# Patient Record
Sex: Female | Born: 1955 | Race: White | Hispanic: No | Smoking: Never smoker
Health system: Southern US, Community
[De-identification: ages and names within clinical notes are randomized; demographics above are authoritative.]

## PROBLEM LIST (undated history)

## (undated) DIAGNOSIS — R519 Headache, unspecified: Secondary | ICD-10-CM

## (undated) DIAGNOSIS — M436 Torticollis: Secondary | ICD-10-CM

## (undated) DIAGNOSIS — Z5189 Encounter for other specified aftercare: Secondary | ICD-10-CM

## (undated) DIAGNOSIS — G243 Spasmodic torticollis: Secondary | ICD-10-CM

## (undated) DIAGNOSIS — T7840XA Allergy, unspecified, initial encounter: Secondary | ICD-10-CM

## (undated) DIAGNOSIS — M5030 Other cervical disc degeneration, unspecified cervical region: Secondary | ICD-10-CM

## (undated) DIAGNOSIS — E785 Hyperlipidemia, unspecified: Secondary | ICD-10-CM

## (undated) DIAGNOSIS — F419 Anxiety disorder, unspecified: Secondary | ICD-10-CM

## (undated) DIAGNOSIS — R7303 Prediabetes: Secondary | ICD-10-CM

## (undated) DIAGNOSIS — S0990XA Unspecified injury of head, initial encounter: Secondary | ICD-10-CM

## (undated) DIAGNOSIS — I10 Essential (primary) hypertension: Secondary | ICD-10-CM

## (undated) DIAGNOSIS — D492 Neoplasm of unspecified behavior of bone, soft tissue, and skin: Secondary | ICD-10-CM

## (undated) DIAGNOSIS — R51 Headache: Secondary | ICD-10-CM

## (undated) DIAGNOSIS — E162 Hypoglycemia, unspecified: Secondary | ICD-10-CM

## (undated) HISTORY — DX: Headache, unspecified: R51.9

## (undated) HISTORY — PX: LEG SURGERY: SHX1003

## (undated) HISTORY — PX: NECK SURGERY: SHX720

## (undated) HISTORY — PX: TUBAL LIGATION: SHX77

## (undated) HISTORY — DX: Other cervical disc degeneration, unspecified cervical region: M50.30

## (undated) HISTORY — DX: Spasmodic torticollis: G24.3

## (undated) HISTORY — DX: Headache: R51

## (undated) HISTORY — DX: Encounter for other specified aftercare: Z51.89

## (undated) HISTORY — DX: Hyperlipidemia, unspecified: E78.5

## (undated) HISTORY — DX: Unspecified injury of head, initial encounter: S09.90XA

## (undated) HISTORY — DX: Anxiety disorder, unspecified: F41.9

## (undated) HISTORY — DX: Neoplasm of unspecified behavior of bone, soft tissue, and skin: D49.2

## (undated) HISTORY — DX: Torticollis: M43.6

## (undated) HISTORY — DX: Hypoglycemia, unspecified: E16.2

## (undated) HISTORY — DX: Essential (primary) hypertension: I10

## (undated) HISTORY — DX: Prediabetes: R73.03

## (undated) HISTORY — DX: Allergy, unspecified, initial encounter: T78.40XA

---
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
## 1992-05-22 HISTORY — PX: KNEE SURGERY: SHX244

## 2013-12-12 DIAGNOSIS — F32A Depression, unspecified: Secondary | ICD-10-CM | POA: Insufficient documentation

## 2013-12-12 DIAGNOSIS — E7800 Pure hypercholesterolemia, unspecified: Secondary | ICD-10-CM | POA: Insufficient documentation

## 2013-12-12 DIAGNOSIS — F329 Major depressive disorder, single episode, unspecified: Secondary | ICD-10-CM | POA: Insufficient documentation

## 2013-12-12 DIAGNOSIS — G243 Spasmodic torticollis: Secondary | ICD-10-CM | POA: Insufficient documentation

## 2014-08-25 LAB — HM MAMMOGRAPHY: HM Mammogram: NORMAL (ref 0–4)

## 2015-07-26 DIAGNOSIS — G243 Spasmodic torticollis: Secondary | ICD-10-CM | POA: Diagnosis not present

## 2015-08-16 ENCOUNTER — Ambulatory Visit (INDEPENDENT_AMBULATORY_CARE_PROVIDER_SITE_OTHER): Payer: Medicare Other | Admitting: Neurology

## 2015-08-16 ENCOUNTER — Telehealth: Payer: Self-pay | Admitting: Neurology

## 2015-08-16 ENCOUNTER — Encounter: Payer: Self-pay | Admitting: Neurology

## 2015-08-16 VITALS — BP 108/70 | HR 100 | Ht 63.75 in | Wt 176.0 lb

## 2015-08-16 DIAGNOSIS — M25512 Pain in left shoulder: Secondary | ICD-10-CM | POA: Diagnosis not present

## 2015-08-16 DIAGNOSIS — G243 Spasmodic torticollis: Secondary | ICD-10-CM | POA: Diagnosis not present

## 2015-08-16 MED ORDER — CLONAZEPAM 1 MG PO TABS: 1.0000 mg | ORAL_TABLET | Freq: Three times a day (TID) | ORAL | Status: DC | PRN

## 2015-08-16 NOTE — Patient Instructions (Signed)
1. Refill Clonazepam sent to [facility].  2. Appt made with Dr Birdie Riddle at [facility], [address] [place], [place] [address] for Wednesday August 25, 2015 at 1:30 pm to arrive at 1:15 pm. If you need to reschedule for any reason you can call them at [phone]. 3. Appt made with Molli Barrows, PA at [facility] on August 26, 2015 at 2:45 pm to arrive at 2:30 pm. If you need to reschedule for any reason you can call them at [phone]. 4. We will call you with an appt for your Botox once we receive insurance approval.
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
## 2015-08-16 NOTE — Telephone Encounter (Signed)
Patient made aware we reviewed previous records and patient was given #90 clonazepam with 3 refills on 07/14/15. Aware RX for Clonazepam from Dr Tat has been cancelled.

## 2015-08-25 ENCOUNTER — Ambulatory Visit (INDEPENDENT_AMBULATORY_CARE_PROVIDER_SITE_OTHER): Payer: Medicare Other | Admitting: Family Medicine

## 2015-08-25 ENCOUNTER — Encounter: Payer: Self-pay | Admitting: Family Medicine

## 2015-08-25 VITALS — BP 108/84 | HR 74 | Temp 98.2°F | Resp 17 | Ht 64.0 in | Wt 176.0 lb

## 2015-08-25 DIAGNOSIS — G243 Spasmodic torticollis: Secondary | ICD-10-CM | POA: Insufficient documentation

## 2015-08-25 DIAGNOSIS — F32A Depression, unspecified: Secondary | ICD-10-CM

## 2015-08-25 DIAGNOSIS — F329 Major depressive disorder, single episode, unspecified: Secondary | ICD-10-CM

## 2015-08-25 DIAGNOSIS — R911 Solitary pulmonary nodule: Secondary | ICD-10-CM | POA: Diagnosis not present

## 2015-08-25 DIAGNOSIS — R7302 Impaired glucose tolerance (oral): Secondary | ICD-10-CM | POA: Diagnosis not present

## 2015-08-25 DIAGNOSIS — E7800 Pure hypercholesterolemia, unspecified: Secondary | ICD-10-CM

## 2015-08-25 LAB — LIPID PANEL
CHOLESTEROL: 222 mg/dL — AB (ref 0–200)
HDL: 47.1 mg/dL (ref >39.00)
LDL Cholesterol: 146 mg/dL — ABNORMAL HIGH (ref 0–99)
NonHDL: 174.44
Total CHOL/HDL Ratio: 5
Triglycerides: 141 mg/dL (ref 0.0–149.0)
VLDL: 28.2 mg/dL (ref 0.0–40.0)

## 2015-08-25 LAB — CBC WITH DIFFERENTIAL/PLATELET
BASOS PCT: 0.4 % (ref 0.0–3.0)
Basophils Absolute: 0 10*3/uL (ref 0.0–0.1)
EOS ABS: 0.1 10*3/uL (ref 0.0–0.7)
EOS PCT: 1.6 % (ref 0.0–5.0)
HCT: 41.1 % (ref 36.0–46.0)
HEMOGLOBIN: 14 g/dL (ref 12.0–15.0)
Lymphocytes Relative: 26 % (ref 12.0–46.0)
Lymphs Abs: 1.1 10*3/uL (ref 0.7–4.0)
MCHC: 34.2 g/dL (ref 30.0–36.0)
MCV: 85.9 fl (ref 78.0–100.0)
MONO ABS: 0.3 10*3/uL (ref 0.1–1.0)
MONOS PCT: 7.2 % (ref 3.0–12.0)
Neutro Abs: 2.8 10*3/uL (ref 1.4–7.7)
Neutrophils Relative %: 64.8 % (ref 43.0–77.0)
Platelets: 242 10*3/uL (ref 150.0–400.0)
RBC: 4.78 Mil/uL (ref 3.87–5.11)
RDW: 13.8 % (ref 11.5–15.5)
WBC: 4.3 10*3/uL (ref 4.0–10.5)

## 2015-08-25 LAB — TSH: TSH: 1.26 u[IU]/mL (ref 0.35–4.50)

## 2015-08-25 LAB — BASIC METABOLIC PANEL
BUN: 14 mg/dL (ref 6–23)
CHLORIDE: 106 meq/L (ref 96–112)
CO2: 28 mEq/L (ref 19–32)
Calcium: 9.6 mg/dL (ref 8.4–10.5)
Creatinine, Ser: 0.98 mg/dL (ref 0.40–1.20)
GFR: 62.05 mL/min (ref >60.00)
Glucose, Bld: 95 mg/dL (ref 70–99)
POTASSIUM: 4 meq/L (ref 3.5–5.1)
Sodium: 140 mEq/L (ref 135–145)

## 2015-08-25 LAB — HEPATIC FUNCTION PANEL
ALT: 22 U/L (ref 0–35)
AST: 19 U/L (ref 0–37)
Albumin: 4.4 g/dL (ref 3.5–5.2)
Alkaline Phosphatase: 45 U/L (ref 39–117)
BILIRUBIN DIRECT: 0.1 mg/dL (ref 0.0–0.3)
BILIRUBIN TOTAL: 0.7 mg/dL (ref 0.2–1.2)
Total Protein: 6.9 g/dL (ref 6.0–8.3)

## 2015-08-25 LAB — HEMOGLOBIN A1C: HEMOGLOBIN A1C: 6 % (ref 4.6–6.5)

## 2015-08-25 NOTE — Assessment & Plan Note (Signed)
New to provider, ongoing for pt.  Feels sxs are well controlled w/ Paxil.  No med changes at this time.  Will follow.

## 2015-08-25 NOTE — Assessment & Plan Note (Signed)
New to provider.  Pt reports she had nodule seen on CXR done 'back in the 90s'.  This was stable on repeat CXR but she has not had recent imaging or follow up.  Discussed that she may need a CT but she would like to start w/ plain films first.

## 2015-08-25 NOTE — Patient Instructions (Signed)
Schedule your complete physical in 6 months Go to [place] (across from [facility]) and get your chest xray done [facility] notify you of your lab results and make any changes if needed Continue to work on healthy diet and regular exercise- you can do it! Call with any questions or concerns Welcome!  We're glad to have you!

## 2015-08-25 NOTE — Assessment & Plan Note (Signed)
New to provider, ongoing for pt.  She is not taking her Lipitor b/c she feels it interferes w/ her Clonazepam.  Encouraged her to try taking this at night and see if she is able to tolerate it.  Pt agreeable to this.

## 2015-08-25 NOTE — Assessment & Plan Note (Signed)
New to provider.  Pt reports hx of this but is not clear on what her sugar or A1C was.  Discussed need for low carb diet and regular exercise.  Check labs.  Will follow.

## 2015-08-25 NOTE — Progress Notes (Signed)
   Subjective:    Patient ID: Jessica Galloway, female    DOB: [birth year], 60 y.o.   MRN: [id number]  HPI New to establish.  Previous MD- Dr Phillip Heal, [place]  Hyperlipidemia- pt was started on Lipitor 20mg  at last PCP visit but she reports this 'works against' her dystonia medication.  She has not been taking regularly due to pain but is willing to try taking before bed.  Denies abd pain, N/V.  Glucose intolerance- pt reports 'hyperglycemia for years'.  At last visit was told she was 'prediabetic'.  Not on any medication.  Is attempting to control w/ diet- 'i don't eat sugar'.  Sugar seems to trigger migraine in pt.  Pt is starting to walk and is hoping to do this regularly for exercise.  No CP, SOB, edema.  Depression- chronic problem, currently on Paxil.  Pt feels sxs are well controlled.  Pt is able to tell a difference when she misses medication.  Pt has 7 children- now living w/ son and daughter-in-law.  Was previously living w/ father in [place] state but he passed away.  Cervical dystonia- chronic problem, now following w/ Dr Tat.  Currently on Parafon and Clonazepam.  Is deciding between ongoing Botox, baclofen, DBS.  Pt states she is not interested in DBS.  Pt reports onset of sxs in 2000.  Lung nodule- pt reports she had nodule seen on imaging in 1990s.  Had f/u plain film and was told this was stable but has not had any recent imaging (>10 yrs)  Review of Systems For ROS see HPI     Objective:   Physical Exam  Constitutional: She is oriented to person, place, and time. She appears well-developed and well-nourished. No distress.  HENT:  Head: Normocephalic and atraumatic.  Eyes: Conjunctivae and EOM are normal. Pupils are equal, round, and reactive to light.  Neck: Normal range of motion. Neck supple. No thyromegaly present.  Cardiovascular: Normal rate, regular rhythm, normal heart sounds and intact distal pulses.   No murmur  heard. Pulmonary/Chest: Effort normal and breath sounds normal. No respiratory distress.  Abdominal: Soft. She exhibits no distension. There is no tenderness.  Musculoskeletal: She exhibits no edema.  Lymphadenopathy:    She has no cervical adenopathy.  Neurological: She is alert and oriented to person, place, and time.  Skin: Skin is warm and dry.  Psychiatric: She has a normal mood and affect. Her behavior is normal.  Vitals reviewed.         Assessment & Plan:

## 2015-08-25 NOTE — Assessment & Plan Note (Signed)
New to provider, ongoing for pt.  Managed by Neuro- Dr Tat.  Will follow along.

## 2015-08-25 NOTE — Progress Notes (Signed)
Pre visit review using our clinic review tool, if applicable. No additional management support is needed unless otherwise documented below in the visit note. 

## 2015-08-26 ENCOUNTER — Ambulatory Visit (INDEPENDENT_AMBULATORY_CARE_PROVIDER_SITE_OTHER)
Admission: RE | Admit: 2015-08-26 | Discharge: 2015-08-26 | Disposition: A | Discharge status: Home / Self Care | Payer: Medicare Other | Source: Ambulatory Visit | Attending: Family Medicine | Admitting: Family Medicine

## 2015-08-26 ENCOUNTER — Encounter: Payer: Self-pay | Admitting: General Practice

## 2015-08-26 DIAGNOSIS — M7532 Calcific tendinitis of left shoulder: Secondary | ICD-10-CM | POA: Diagnosis not present

## 2015-08-26 DIAGNOSIS — M25512 Pain in left shoulder: Secondary | ICD-10-CM | POA: Diagnosis not present

## 2015-08-26 DIAGNOSIS — L929 Granulomatous disorder of the skin and subcutaneous tissue, unspecified: Secondary | ICD-10-CM | POA: Diagnosis not present

## 2015-08-26 DIAGNOSIS — R911 Solitary pulmonary nodule: Secondary | ICD-10-CM

## 2015-08-30 ENCOUNTER — Telehealth: Payer: Self-pay | Admitting: Family Medicine

## 2015-08-30 ENCOUNTER — Telehealth: Payer: Self-pay | Admitting: Neurology

## 2015-08-30 MED ORDER — PAROXETINE HCL 40 MG PO TABS: 40.0000 mg | ORAL_TABLET | ORAL | Status: DC

## 2015-08-30 MED ORDER — GABAPENTIN 300 MG PO CAPS: 300.0000 mg | ORAL_CAPSULE | Freq: Three times a day (TID) | ORAL | Status: DC

## 2015-08-30 NOTE — Telephone Encounter (Signed)
Patient made aware these prescriptions should go to Dr Birdie Riddle. She has called and gotten them filled through her office. She will call if needed.

## 2015-08-30 NOTE — Telephone Encounter (Signed)
Pt needs her paxil , neurotin and gabapentin refilled she would like [facility] at [phone] and her call back number is [phone]

## 2015-08-30 NOTE — Telephone Encounter (Signed)
Medication filled to pharmacy as requested.   

## 2015-08-30 NOTE — Telephone Encounter (Signed)
Pt states that she forgot to let Dr Birdie Riddle know that she needed a refill on paroxetine and gabapentin when she saw her last. Pt states that she will be out of meds tomorrow. I did make pt aware that Dr Birdie Riddle is out of the office this week. Pt asked if anyone else could call this in.

## 2015-09-15 ENCOUNTER — Telehealth: Payer: Self-pay

## 2015-09-15 MED ORDER — CLONAZEPAM 1 MG PO TABS: 1.0000 mg | ORAL_TABLET | Freq: Three times a day (TID) | ORAL | Status: DC

## 2015-09-15 MED ORDER — CHLORZOXAZONE 500 MG PO TABS: 500.0000 mg | ORAL_TABLET | Freq: Four times a day (QID) | ORAL | Status: DC | PRN

## 2015-09-15 NOTE — Telephone Encounter (Signed)
Ok for #90 of each 

## 2015-09-15 NOTE — Telephone Encounter (Signed)
Patient called to request refills on her Clonazepam 1 mg and her Chlorzoxazone 500 mg, advises that pharmacy is [place], [place]

## 2015-09-15 NOTE — Telephone Encounter (Signed)
Medication filled to pharmacy as requested.   

## 2015-09-21 ENCOUNTER — Telehealth: Payer: Self-pay | Admitting: Family Medicine

## 2015-09-21 NOTE — Telephone Encounter (Signed)
Pt needs refill on paxil and gabapentin, [facility] [address]

## 2015-09-22 MED ORDER — GABAPENTIN 300 MG PO CAPS: 300.0000 mg | ORAL_CAPSULE | Freq: Three times a day (TID) | ORAL | Status: DC

## 2015-09-22 MED ORDER — PAROXETINE HCL 40 MG PO TABS: 40.0000 mg | ORAL_TABLET | ORAL | Status: DC

## 2015-09-22 NOTE — Telephone Encounter (Signed)
Medication filled to pharmacy as requested.   

## 2015-09-27 DIAGNOSIS — M436 Torticollis: Secondary | ICD-10-CM | POA: Diagnosis not present

## 2015-09-27 DIAGNOSIS — M7532 Calcific tendinitis of left shoulder: Secondary | ICD-10-CM | POA: Diagnosis not present

## 2015-10-11 DIAGNOSIS — M25512 Pain in left shoulder: Secondary | ICD-10-CM | POA: Diagnosis not present

## 2015-10-14 ENCOUNTER — Other Ambulatory Visit: Payer: Self-pay | Admitting: Family Medicine

## 2015-10-14 MED ORDER — CLONAZEPAM 1 MG PO TABS: 1.0000 mg | ORAL_TABLET | Freq: Three times a day (TID) | ORAL | Status: DC

## 2015-10-14 MED ORDER — CHLORZOXAZONE 500 MG PO TABS: 500.0000 mg | ORAL_TABLET | Freq: Four times a day (QID) | ORAL | Status: DC | PRN

## 2015-10-14 NOTE — Telephone Encounter (Signed)
Pt states that she needs refill on chlorzoxazone and clonazepam, [facility] in [place]

## 2015-10-14 NOTE — Telephone Encounter (Signed)
Last OV 09/15/15 Parafon last filled 09/15/15 #90 with 0 Clonazepam last filled 09/15/15 #90 with 0

## 2015-10-14 NOTE — Telephone Encounter (Signed)
Medication filled to pharmacy as requested.   

## 2015-10-20 ENCOUNTER — Telehealth: Payer: Self-pay | Admitting: Family Medicine

## 2015-10-20 MED ORDER — GABAPENTIN 300 MG PO CAPS: 300.0000 mg | ORAL_CAPSULE | Freq: Three times a day (TID) | ORAL | Status: DC

## 2015-10-20 MED ORDER — PAROXETINE HCL 40 MG PO TABS: 40.0000 mg | ORAL_TABLET | ORAL | Status: DC

## 2015-10-20 MED ORDER — ATORVASTATIN CALCIUM 20 MG PO TABS: 20.0000 mg | ORAL_TABLET | Freq: Every day | ORAL | Status: DC

## 2015-10-20 NOTE — Telephone Encounter (Signed)
Medication filled to pharmacy as requested.   

## 2015-10-20 NOTE — Telephone Encounter (Signed)
Patient calling to request refills of the following meds:  gabapentin (NEURONTIN) 300 MG capsule atorvastatin (LIPITOR) 20 MG tablet PARoxetine (PAXIL) 40 MG tablet  Pharmacy:  [facility] [place], [place]

## 2015-10-26 DIAGNOSIS — G243 Spasmodic torticollis: Secondary | ICD-10-CM | POA: Diagnosis not present

## 2015-10-26 DIAGNOSIS — M50823 Other cervical disc disorders at C6-C7 level: Secondary | ICD-10-CM | POA: Diagnosis not present

## 2015-10-26 DIAGNOSIS — M50822 Other cervical disc disorders at C5-C6 level: Secondary | ICD-10-CM | POA: Diagnosis not present

## 2015-11-06 DIAGNOSIS — G243 Spasmodic torticollis: Secondary | ICD-10-CM | POA: Diagnosis not present

## 2015-11-12 ENCOUNTER — Ambulatory Visit: Payer: Medicare Other | Admitting: Neurology

## 2015-11-12 ENCOUNTER — Telehealth: Payer: Self-pay | Admitting: Neurology

## 2015-11-12 DIAGNOSIS — Z029 Encounter for administrative examinations, unspecified: Secondary | ICD-10-CM

## 2015-11-12 NOTE — Telephone Encounter (Signed)
Appt r/s to August 11.

## 2015-11-12 NOTE — Telephone Encounter (Signed)
Jessica Galloway 10/26/2055. She had to cancel her appointment for today. She would like you to call her regarding rescheduling  her Botox injection. He number is [phone]. Thank you

## 2015-11-15 DIAGNOSIS — M7532 Calcific tendinitis of left shoulder: Secondary | ICD-10-CM | POA: Diagnosis not present

## 2015-11-15 DIAGNOSIS — M436 Torticollis: Secondary | ICD-10-CM | POA: Diagnosis not present

## 2015-11-15 DIAGNOSIS — M75112 Incomplete rotator cuff tear or rupture of left shoulder, not specified as traumatic: Secondary | ICD-10-CM | POA: Diagnosis not present

## 2015-11-16 ENCOUNTER — Telehealth: Payer: Self-pay | Admitting: Family Medicine

## 2015-11-16 NOTE — Telephone Encounter (Signed)
Patient calling to state she will be leaving to go out of town on Thursday until 12/10/15, heading to [place] and [place].  She needs refills of the following medications before she leaves so she does not run out of medication:  1. gabapentin (NEURONTIN) 300 MG capsule  2. tazanadine 4 mg    3. PARoxetine (PAXIL) 40 MG tablet  Ph[facility]:  [facility]  Also patient states she unable to take the Lipitor due to experiencing severe neck spasms.  She wants to know if there is an alternative medication she can take instead.

## 2015-11-17 MED ORDER — PAROXETINE HCL 40 MG PO TABS: 40.0000 mg | ORAL_TABLET | ORAL | Status: DC

## 2015-11-17 MED ORDER — GABAPENTIN 300 MG PO CAPS: 300.0000 mg | ORAL_CAPSULE | Freq: Three times a day (TID) | ORAL | Status: DC

## 2015-11-17 MED ORDER — EZETIMIBE 10 MG PO TABS: 10.0000 mg | ORAL_TABLET | Freq: Every day | ORAL | Status: DC

## 2015-11-17 MED ORDER — TIZANIDINE HCL 4 MG PO TABS: 4.0000 mg | ORAL_TABLET | Freq: Three times a day (TID) | ORAL | Status: DC | PRN

## 2015-11-17 NOTE — Telephone Encounter (Signed)
I refilled the patients Gabapentin and the paxil. Please advise on the tizanidine as it is not on the patients medication list at all

## 2015-11-17 NOTE — Telephone Encounter (Signed)
Medication filled to pharmacy as requested.  Will notify pt.

## 2015-11-17 NOTE — Addendum Note (Signed)
Addended by: Davis Gourd on: 11/17/2015 09:53 AM   Modules accepted: Orders

## 2015-11-17 NOTE — Telephone Encounter (Signed)
Ok to refill Tizanidine 4mg  TID prn, #30 Can switch to Zetia 10mg  daily rather than Lipitor

## 2015-11-26 ENCOUNTER — Telehealth: Payer: Self-pay | Admitting: Family Medicine

## 2015-11-26 MED ORDER — TIZANIDINE HCL 4 MG PO TABS: 4.0000 mg | ORAL_TABLET | Freq: Three times a day (TID) | ORAL | Status: DC | PRN

## 2015-11-26 NOTE — Telephone Encounter (Signed)
Patient states she is in [place] and is running low on her medication.  She is requesting to have a refill of tiZANidine (ZANAFLEX) 4 MG tablet sent to the [facility] in [place], [place], [phone]

## 2015-11-26 NOTE — Telephone Encounter (Signed)
Medication filled to pharmacy as requested.   

## 2015-11-26 NOTE — Telephone Encounter (Signed)
Prescription just filled on 6/28- we can send new prescription but is there a way for this to be transferred

## 2015-11-26 NOTE — Telephone Encounter (Signed)
Pt seen on 08/25/15 Tizanidine last filled 11/17/15 #30 with with 3

## 2015-12-09 ENCOUNTER — Other Ambulatory Visit: Payer: Self-pay | Admitting: Family Medicine

## 2015-12-09 MED ORDER — TIZANIDINE HCL 4 MG PO TABS: 4.0000 mg | ORAL_TABLET | Freq: Three times a day (TID) | ORAL | Status: DC | PRN

## 2015-12-09 NOTE — Telephone Encounter (Signed)
Last OV 08/25/15 Tizanidine last filled 11/26/15 #30 with 0 (to pharmacy in [place])

## 2015-12-09 NOTE — Telephone Encounter (Signed)
Medication filled to pharmacy as requested.   

## 2015-12-09 NOTE — Telephone Encounter (Signed)
Pt calling for refills on her tizanidine sent to [facility] on in [place] please.

## 2015-12-10 DIAGNOSIS — G243 Spasmodic torticollis: Secondary | ICD-10-CM | POA: Diagnosis not present

## 2015-12-10 DIAGNOSIS — M436 Torticollis: Secondary | ICD-10-CM | POA: Diagnosis not present

## 2015-12-10 DIAGNOSIS — M50823 Other cervical disc disorders at C6-C7 level: Secondary | ICD-10-CM | POA: Diagnosis not present

## 2015-12-10 DIAGNOSIS — M50822 Other cervical disc disorders at C5-C6 level: Secondary | ICD-10-CM | POA: Diagnosis not present

## 2015-12-15 ENCOUNTER — Telehealth: Payer: Self-pay | Admitting: Neurology

## 2015-12-16 NOTE — Telephone Encounter (Signed)
Spoke with patient. She states yesterday every time she got up from resting she felt dizziness and like her body was pulling to the right. This hasn't happened prior to this and not happening today. She will keep an eye on it and if she has anymore episodes she will make [place] or her PCP aware.   She also states she had a cervical MR at her orthopaedic MD and they found a mass on her spine. They have referred her to Dr. Ronnald Ramp but she does not have an appt yet. Made her aware that she should proceed with seeing Dr. Ronnald Ramp for his opinion. Dr. Nelva Bush is to be sending our office the film reports.   She will call with any other concerns.

## 2015-12-21 NOTE — Telephone Encounter (Signed)
Films requested from Dr. Nelva Bush.

## 2015-12-21 NOTE — Telephone Encounter (Signed)
Please get MRI films, not just reports.  As for dizziness, I haven't seen her since March and that wasn't a c/o then so haven't evaluated that at all and only thing that I can really say is that doesn't have to do with cervical dystonia.  Happy to re-evaluate her if needed.  Is she sure that this is not medication related as she is on several that could cause this, including fairly high dose klonopin and also the tizanidine.

## 2015-12-29 ENCOUNTER — Telehealth: Payer: Self-pay | Admitting: Neurology

## 2015-12-29 NOTE — Telephone Encounter (Signed)
I never got MRI films as patient said that ortho would be sending (that I know of) or report from Dr. Ronnald Ramp.  While it is separate issue from her dystonia likely, I am not sure that I want to do botox until I see films and find out if Dr. Ronnald Ramp doing surgery/get report from him.

## 2015-12-29 NOTE — Telephone Encounter (Signed)
Requested note from Dr. Nelva Bush and MR films on 12/21/15. Left a message with their medical records department requesting them again.

## 2015-12-29 NOTE — Telephone Encounter (Signed)
Yes.  She same day cx or ns last one

## 2015-12-29 NOTE — Telephone Encounter (Signed)
She doesn't have appt with Dr. Ronnald Ramp until January 10, 2016. Having trouble getting records from Dr. Nelva Bush. Do you want me to cancel Botox for now?

## 2015-12-30 NOTE — Telephone Encounter (Signed)
Tried to call patient with no answer and no way to leave message. Will try again later.

## 2015-12-30 NOTE — Telephone Encounter (Signed)
Patient made aware. Appt cancelled.

## 2015-12-30 NOTE — Telephone Encounter (Signed)
I need her to see neurosurgery first.  I know that she is in pain, but don't want to do harm.

## 2015-12-30 NOTE — Telephone Encounter (Signed)
Spoke with patient and her daughter. She states that they have a disc of films of the MR. They are asking if they bring the films tomorrow would it be at all possible to look at them tomorrow and possibly Botox her because she is in a lot of pain. I told them you probably want her to see Dr. Ronnald Ramp first and they ask if that is the case how long after that appt would she have to wait to get in? Next Botox day 02/11/2016. Please advise.

## 2015-12-31 ENCOUNTER — Ambulatory Visit: Payer: Medicare Other | Admitting: Neurology

## 2016-01-11 ENCOUNTER — Other Ambulatory Visit: Payer: Self-pay | Admitting: Family Medicine

## 2016-01-11 NOTE — Telephone Encounter (Signed)
Last OV 08/25/15 Clonazepam last filled 10/14/15 #90 with 1

## 2016-01-11 NOTE — Telephone Encounter (Signed)
Pt needs refill on [facility]epam, [facility] on [address]

## 2016-01-12 MED ORDER — CLONAZEPAM 1 MG PO TABS: 1.0000 mg | ORAL_TABLET | Freq: Three times a day (TID) | ORAL | 1 refills | Status: DC

## 2016-01-12 NOTE — Telephone Encounter (Signed)
Medication filled to pharmacy as requested.   

## 2016-01-14 ENCOUNTER — Telehealth: Payer: Self-pay | Admitting: General Practice

## 2016-01-14 ENCOUNTER — Telehealth: Payer: Self-pay | Admitting: Family Medicine

## 2016-01-14 NOTE — Telephone Encounter (Signed)
Medication resent

## 2016-01-14 NOTE — Telephone Encounter (Signed)
  Cannot reach pt by phone, and this medication was filled to her pharmacy on 01/12/16.    [facility] Patient Name: NIDIA FEATHER Gender: Female DOB: 08/08/1955 Age: 60 Y 11 M 9 D Return Phone Number: [phone] (Primary) Address: City/State/Zip: [place] [address] Client [place] Client Site [place] Physician Dimple Nanas - MD Contact Type Call Who Is Calling Patient / Member / Family / Caregiver Call Type Triage / Clinical Relationship To Patient Self Return Phone Number [phone] (Primary) Chief Complaint Prescription Refill or Medication Request (non symptomatic) Reason for Call Medication Question Initial Comment Caller states she is out of her prescription and she is going out of town until tuesday. Caller states she wants to know if her clonazepam prescription can be called [facility] to [facility] on [address] for her. Translation No Nurse Assessment Nurse: Rock Nephew, RN, Marliss Coots Date/Time (Eastern Time): 01/13/2016 7:27:47 PM Please select the assessment type ---Request for controlled medication refill Additional Documentation ---Caller states she is out of her prescription and she is going out of town until Tuesday. Caller states she wants to know if her clonazepam prescription can be called in to [facility] for her. Is there an on-call physician for the client? ---Yes Do the client directives specifically allow for paging the on-call regarding scheduled drugs? ---No Additional Documentation ---Advised caller that the nurse will send the message into the office for her refill request. Advised caller that since she is going out of town in the afternoon she might want to call the office to ensure someone is taking care of the refill for her. Guidelines Guideline  Title Affirmed Question Affirmed Notes Nurse Date/Time (Eastern Time) Disp. Time Eilene Ghazi Time) Disposition Final User 01/13/2016 7:32:51 PM Clinical Call Yes Rock Nephew, RN, Marliss Coots

## 2016-01-14 NOTE — Telephone Encounter (Signed)
Pt states that [facility] is saying they have not received the refill request for clonazepam and pt is asking if we can resend it. Pt is due to go out of town this afternoon and needs this refilled

## 2016-01-18 DIAGNOSIS — Z6829 Body mass index (BMI) 29.0-29.9, adult: Secondary | ICD-10-CM | POA: Diagnosis not present

## 2016-01-18 DIAGNOSIS — G959 Disease of spinal cord, unspecified: Secondary | ICD-10-CM | POA: Diagnosis not present

## 2016-01-20 ENCOUNTER — Encounter: Payer: Self-pay | Admitting: Family Medicine

## 2016-01-20 ENCOUNTER — Encounter: Payer: Self-pay | Admitting: Internal Medicine

## 2016-01-20 ENCOUNTER — Ambulatory Visit (INDEPENDENT_AMBULATORY_CARE_PROVIDER_SITE_OTHER): Payer: Medicare Other | Admitting: Family Medicine

## 2016-01-20 VITALS — BP 110/82 | HR 89 | Temp 98.1°F | Resp 16 | Ht 64.0 in | Wt 167.2 lb

## 2016-01-20 DIAGNOSIS — Z1231 Encounter for screening mammogram for malignant neoplasm of breast: Secondary | ICD-10-CM

## 2016-01-20 DIAGNOSIS — Z1211 Encounter for screening for malignant neoplasm of colon: Secondary | ICD-10-CM

## 2016-01-20 DIAGNOSIS — G9589 Other specified diseases of spinal cord: Secondary | ICD-10-CM

## 2016-01-20 DIAGNOSIS — G959 Disease of spinal cord, unspecified: Secondary | ICD-10-CM | POA: Diagnosis not present

## 2016-01-20 MED ORDER — TIZANIDINE HCL 4 MG PO TABS: 4.0000 mg | ORAL_TABLET | Freq: Three times a day (TID) | ORAL | 3 refills | Status: DC | PRN

## 2016-01-20 NOTE — Progress Notes (Signed)
   Subjective:    Patient ID: Jessica Galloway, female    DOB: 04/29/1956, 60 y.o.   MRN: [id number]  HPI Neck issue- pt has been seeing Dr Theda Sers and Dr Nelva Bush.  Did MRI which showed abnormality at C2 that is 1.5x1x0.9 cm w/ L sided ventral flattening of cord.  Radiology felt it was a possible meningioma.  Pt saw Dr Ronnald Ramp (Neurosurgery) who felt it was too small to do anything about.  Pt has a 2nd opinion schedule for January at [facility] in [place] w/ Dr Bain Whichard Basset.  Pt's daughter lives in [place].  Health maintenance- pt has not had pap, mammo, or colonoscopy.  Recent finding concerned her for possible other cancers.   Review of Systems For ROS see HPI     Objective:   Physical Exam  Constitutional: She is oriented to person, place, and time. She appears well-developed and well-nourished. No distress.  Neurological: She is alert and oriented to person, place, and time.  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  Psychiatric:  Flat affect Normal linear thought process  Vitals reviewed.         Assessment & Plan:  Cervical neck mass- discussed options for her 2nd opinion as [place] is very far and currently flooded with no estimated time to return to normalcy.  Discussed [facility] but she is not interested in commuting from [place], [place].  Has a daughter in [place] that she can stay with when she has her appt.  Pt is comfortable waiting until January as Dr Ronnald Ramp told her very clearly that this was not cancer.  Will follow along and assist as able  Health Maintenance- agree that pt needs all of her routine screening done.  Will re-order mammo, refer to GI, and will do her pap at upcoming CPE in October.  Pt expressed understanding and is in agreement w/ plan.

## 2016-01-20 NOTE — Progress Notes (Signed)
Pre visit review using our clinic review tool, if applicable. No additional management support is needed unless otherwise documented below in the visit note. 

## 2016-01-20 NOTE — Patient Instructions (Addendum)
Follow up as scheduled for your physical w/ pap We'll call you with your mammogram and GI appt for breast and colon cancer screening I think getting a 2nd opinion in [place] is completely reasonable given the family support Call with any questions or concerns Happy Early Rudene Anda!!!

## 2016-01-26 ENCOUNTER — Telehealth: Payer: Self-pay | Admitting: Neurology

## 2016-01-26 NOTE — Telephone Encounter (Signed)
PT called and wanted a botox appointment/Dawn CB#[phone]

## 2016-01-26 NOTE — Telephone Encounter (Signed)
Records received from Dr. Ronnald Ramp and placed on your desk. Please advise.

## 2016-01-27 NOTE — Telephone Encounter (Signed)
Reviewed notes from Dr. Ronnald Ramp.  Said okay for botox.  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  Okay to schedule for botox

## 2016-01-27 NOTE — Telephone Encounter (Signed)
Offered appt on September 22 and patient can not come in.   Scheduled for November 3.

## 2016-02-11 ENCOUNTER — Ambulatory Visit: Payer: Medicare Other | Admitting: Neurology

## 2016-02-15 ENCOUNTER — Other Ambulatory Visit: Payer: Self-pay | Admitting: Family Medicine

## 2016-02-15 DIAGNOSIS — Z1231 Encounter for screening mammogram for malignant neoplasm of breast: Secondary | ICD-10-CM

## 2016-02-22 ENCOUNTER — Telehealth: Payer: Self-pay | Admitting: Family Medicine

## 2016-02-22 MED ORDER — TIZANIDINE HCL 4 MG PO TABS: 4.0000 mg | ORAL_TABLET | Freq: Three times a day (TID) | ORAL | 3 refills | Status: DC | PRN

## 2016-02-22 MED ORDER — GABAPENTIN 300 MG PO CAPS: 300.0000 mg | ORAL_CAPSULE | Freq: Three times a day (TID) | ORAL | 3 refills | Status: DC

## 2016-02-22 NOTE — Telephone Encounter (Signed)
Medication filled to pharmacy as requested.   

## 2016-02-22 NOTE — Telephone Encounter (Signed)
Pt states that she is still out of town with her sister that has had surgery and needs refill on tizanidine and gabapentin, [facility] [place] phone # [phone]

## 2016-02-29 ENCOUNTER — Encounter: Payer: Medicare Other | Admitting: Family Medicine

## 2016-03-06 ENCOUNTER — Encounter: Payer: Medicare Other | Admitting: Internal Medicine

## 2016-03-13 ENCOUNTER — Ambulatory Visit: Payer: Medicare Other

## 2016-03-17 ENCOUNTER — Other Ambulatory Visit: Payer: Self-pay | Admitting: General Practice

## 2016-03-17 MED ORDER — CLONAZEPAM 1 MG PO TABS: 1.0000 mg | ORAL_TABLET | Freq: Three times a day (TID) | ORAL | 1 refills | Status: DC

## 2016-03-17 NOTE — Telephone Encounter (Signed)
Pt states that her sister has cancer and she is traveling between [place] and [place]. She is going to be back in the area next month so that she can see Dr. Carles Collet and hopefully remain home until after Thanksgiving, and then she will have to travel back to [place].

## 2016-03-17 NOTE — Telephone Encounter (Signed)
Last OV 01/20/16 Clonazepam last filled 01/12/16 #90 with 1   Please advise the pharmacy is in [place], that is where last Rx went also

## 2016-03-17 NOTE — Telephone Encounter (Signed)
Medication filled to pharmacy as requested.   

## 2016-03-17 NOTE — Telephone Encounter (Signed)
Please find out why pt is in [place] and if she is returning.  We cannot continue to fill controlled substances across state lines

## 2016-03-24 ENCOUNTER — Ambulatory Visit (INDEPENDENT_AMBULATORY_CARE_PROVIDER_SITE_OTHER): Payer: Medicare Other | Admitting: Neurology

## 2016-03-24 DIAGNOSIS — G243 Spasmodic torticollis: Secondary | ICD-10-CM

## 2016-03-24 MED ORDER — ONABOTULINUMTOXINA 100 UNITS IJ SOLR
250.0000 [IU] | Freq: Once | INTRAMUSCULAR | Status: AC
  Administered 2016-03-24: 250 [IU] via INTRAMUSCULAR

## 2016-03-24 NOTE — Procedures (Signed)
[facility]   Procedure Note Botox  Attending: Dr. Wells Guiles Dallas Scorsone  Preoperative Diagnosis(es): Cervical Dystonia  Result History  Onset of effect: n/a  Duration of Benefit: n/a  Adverse Effects: n/a   Consent obtained from: The patient Benefits discussed included, but were not limited to decreased muscle tightness, increased joint range of motion, and decreased pain.  Risk discussed included, but were not limited pain and discomfort, bleeding, bruising, excessive weakness, venous thrombosis, muscle atrophy, death and dysphagia.  A copy of the patient medication guide was given to the patient which explains the blackbox warning.  Patients identity and treatment sites confirmed Yes.  .  Details of Procedure: Skin was cleaned with alcohol.  A 30 gauge, 72mm hollow monopolor needle was introduced to the target muscle (except posterior splenius, where 27g,1.5 inch needle was used).   Prior to injection, the needle plunger was aspirated to make sure the needle was not within a blood vessel.  There was no blood retrieved on aspiration.    Following is a summary of the muscles injected  And the amount of Botulinum toxin used:   Dilution 0.9% preservative free saline mixed with 100 u Botox type A to make 10 U per 0.1cc  Injections  Location Left  Right Units Number of sites        Sternocleidomastoid      Splenius Capitus, posterior approach 100  100 1  Splenius Capitus, lateral approach 60  60 1  Levator Scapulae 20  20 1   Trapezius 20/20/20  60 3  Scalene complex 10  10 1   TOTAL UNITS:   250    Agent: Botulinum Type A ( Onobotulinum Toxin type A ).  3 vials of Botox were used, each containing 100 units and freshly diluted with 1 mL of sterile, non-preserved saline   Total injected (Units): 250  Total wasted (Units): 10   Pt tolerated procedure well without complications.   Reinjection is anticipated in 3 months. Asked pt multiple times to make a follow-up appointment with me  in the next 4 weeks, but she states that she may be out of town.  I asked her to make an appointment, therefore, within 6 weeks, but she felt she was unable to do that.  I told her I would like to see her sometime in the next 3-8 weeks so I can see how Botox was working and asked her to go ahead and make the appointment before she left and she could cancel it if she was not in town.  Upon leaving, she refused to make the appointment.  She did ask me for refill of her clonazepam and tizanidine, but she has been receiving those medications through Dr. Birdie Riddle and brings bottles that are full.  She will need to continue to receive these medications through her prescribing physician.

## 2016-03-28 ENCOUNTER — Encounter: Payer: Medicare Other | Admitting: Internal Medicine

## 2016-03-28 ENCOUNTER — Telehealth: Payer: Self-pay | Admitting: Family Medicine

## 2016-03-28 MED ORDER — PAROXETINE HCL 40 MG PO TABS: 40.0000 mg | ORAL_TABLET | ORAL | 6 refills | Status: DC

## 2016-03-28 NOTE — Telephone Encounter (Signed)
Pt states that she needs a refill on paroxetine, [facility] [place]

## 2016-03-28 NOTE — Telephone Encounter (Signed)
Medication filled to pharmacy as requested.   

## 2016-04-03 ENCOUNTER — Ambulatory Visit (AMBULATORY_SURGERY_CENTER): Payer: Self-pay | Admitting: *Deleted

## 2016-04-03 VITALS — Ht 63.75 in | Wt 163.0 lb

## 2016-04-03 DIAGNOSIS — Z800 Family history of malignant neoplasm of digestive organs: Secondary | ICD-10-CM

## 2016-04-03 MED ORDER — NA SULFATE-K SULFATE-MG SULF 17.5-3.13-1.6 GM/177ML PO SOLN: 1.0000 | Freq: Once | ORAL | 0 refills | Status: AC

## 2016-04-03 NOTE — Progress Notes (Signed)
No egg or soy allergy known to patient  No issues with past sedation with any surgeries  or procedures, no intubation problems  No diet pills per patient No home 02 use per patient  No blood thinners per patient  Pt denies issues with constipation - regular stools mostly controlled with diet  No A fib or A flutter   emmi declined

## 2016-04-17 ENCOUNTER — Ambulatory Visit (AMBULATORY_SURGERY_CENTER): Payer: Medicare Other | Admitting: Internal Medicine

## 2016-04-17 ENCOUNTER — Encounter: Payer: Self-pay | Admitting: Internal Medicine

## 2016-04-17 ENCOUNTER — Other Ambulatory Visit: Payer: Self-pay | Admitting: Family Medicine

## 2016-04-17 VITALS — BP 110/59 | HR 51 | Temp 98.4°F | Resp 16 | Ht 63.75 in | Wt 163.0 lb

## 2016-04-17 DIAGNOSIS — Z1211 Encounter for screening for malignant neoplasm of colon: Secondary | ICD-10-CM

## 2016-04-17 DIAGNOSIS — Z1212 Encounter for screening for malignant neoplasm of rectum: Secondary | ICD-10-CM | POA: Diagnosis not present

## 2016-04-17 DIAGNOSIS — D123 Benign neoplasm of transverse colon: Secondary | ICD-10-CM | POA: Diagnosis not present

## 2016-04-17 DIAGNOSIS — F329 Major depressive disorder, single episode, unspecified: Secondary | ICD-10-CM | POA: Diagnosis not present

## 2016-04-17 DIAGNOSIS — Z800 Family history of malignant neoplasm of digestive organs: Secondary | ICD-10-CM

## 2016-04-17 DIAGNOSIS — F419 Anxiety disorder, unspecified: Secondary | ICD-10-CM | POA: Diagnosis not present

## 2016-04-17 DIAGNOSIS — E7800 Pure hypercholesterolemia, unspecified: Secondary | ICD-10-CM | POA: Diagnosis not present

## 2016-04-17 MED ORDER — CLONAZEPAM 1 MG PO TABS: 1.0000 mg | ORAL_TABLET | Freq: Three times a day (TID) | ORAL | 1 refills | Status: DC

## 2016-04-17 MED ORDER — TIZANIDINE HCL 4 MG PO TABS: 4.0000 mg | ORAL_TABLET | Freq: Three times a day (TID) | ORAL | 1 refills | Status: DC | PRN

## 2016-04-17 MED ORDER — SODIUM CHLORIDE 0.9 % IV SOLN: 500.0000 mL | INTRAVENOUS | Status: AC

## 2016-04-17 MED ORDER — GABAPENTIN 300 MG PO CAPS: 300.0000 mg | ORAL_CAPSULE | Freq: Three times a day (TID) | ORAL | 1 refills | Status: AC

## 2016-04-17 NOTE — Telephone Encounter (Signed)
Last OV 01/20/16 Clonazepam last filled 03/17/16 #90 with 1  Please advise if filled for #90 clonazepam will be #270

## 2016-04-17 NOTE — Progress Notes (Signed)
Pt. Requested her CBG be taken, as she is being evaluated for hypoglycemia.   BS 75.  Pt. Given graham crackers with peanut butter.   Declined juice so as to avoid BS plummeting.  Pt. Requested water.

## 2016-04-17 NOTE — Progress Notes (Signed)
Called to room to assist during endoscopic procedure.  Patient ID and intended procedure confirmed with present staff. Received instructions for my participation in the procedure from the performing physician.  

## 2016-04-17 NOTE — Telephone Encounter (Signed)
pt needs refill on tizanidine, gabapentin and clonazepam, [facility] in [place], Pt asking if this can be for a 90 day supply. Pt will be traveling.

## 2016-04-17 NOTE — Op Note (Signed)
[facility] Patient Name: Jessica Galloway Procedure Date: 04/17/2016 1:31 PM MRN: [id number] Endoscopist: Jerene Bears , MD Age: 60 Referring MD:  Date of Birth: March 10, 1956 Gender: Female Account #: [id number] Procedure:                Colonoscopy Indications:              Screening in patient at increased risk: Family                            history of 1st-degree relative with colorectal                            cancer, This is the patient's first colonoscopy Medicines:                Monitored Anesthesia Care Procedure:                Pre-Anesthesia Assessment:                           - Prior to the procedure, a History and Physical                            was performed, and patient medications and                            allergies were reviewed. The patient's tolerance of                            previous anesthesia was also reviewed. The risks                            and benefits of the procedure and the sedation                            options and risks were discussed with the patient.                            All questions were answered, and informed consent                            was obtained. Prior Anticoagulants: The patient has                            taken no previous anticoagulant or antiplatelet                            agents. ASA Grade Assessment: II - A patient with                            mild systemic disease. After reviewing the risks                            and benefits, the patient was deemed in  satisfactory condition to undergo the procedure.                           After obtaining informed consent, the colonoscope                            was passed under direct vision. Throughout the                            procedure, the patient's blood pressure, pulse, and                            oxygen saturations were monitored continuously. The                            [id number]  [id number]) was introduced through the anus                            and advanced to the the cecum, identified by                            appendiceal orifice and ileocecal valve. The                            colonoscopy was performed without difficulty. The                            patient tolerated the procedure well. The quality                            of the bowel preparation was good. The ileocecal                            valve, appendiceal orifice, and rectum were                            photographed. Scope In: 1:43:00 PM Scope Out: 1:57:16 PM Scope Withdrawal Time: 0 hours 10 minutes 15 seconds  Total Procedure Duration: 0 hours 14 minutes 16 seconds  Findings:                 The perianal and digital rectal examinations were                            normal.                           A 4 mm polyp was found in the transverse colon. The                            polyp was sessile. The polyp was removed with a                            cold snare. Resection and retrieval were complete.  The exam was otherwise without abnormality on                            direct and retroflexion views. Complications:            No immediate complications. Estimated Blood Loss:     Estimated blood loss: none. Impression:               - One 4 mm polyp in the transverse colon, removed                            with a cold snare. Resected and retrieved.                           - The examination was otherwise normal on direct                            and retroflexion views. Recommendation:           - Patient has a contact number available for                            emergencies. The signs and symptoms of potential                            delayed complications were discussed with the                            patient. Return to normal activities tomorrow.                            Written discharge instructions were provided to the                             patient.                           - Resume previous diet.                           - Continue present medications.                           - Await pathology results.                           - Repeat colonoscopy in 5 years. Further                            recommendations, if any, once pathology results                            reviewed. Jerene Bears, MD 04/17/2016 2:01:01 PM This report has been signed electronically.

## 2016-04-17 NOTE — Progress Notes (Signed)
Patient was confused about her medications.  She told me several different dates before she settled on the dates given.   She states that she will speak to Dr. Birdie Riddle regarding her medications.  She denies any seizure activity.    States that she has not had an MRI of her head in quite awhile.

## 2016-04-17 NOTE — Telephone Encounter (Signed)
Medication filled to pharmacy as requested.   

## 2016-04-17 NOTE — Patient Instructions (Signed)
Impression/Recommendations:  Polyp handout given to patient.  Repeat colonoscopy in 5 years based on pathology results.  YOU HAD AN ENDOSCOPIC PROCEDURE TODAY AT [facility]:   Refer to the procedure report that was given to you for any specific questions about what was found during the examination.  If the procedure report does not answer your questions, please call your gastroenterologist to clarify.  If you requested that your care partner not be given the details of your procedure findings, then the procedure report has been included in a sealed envelope for you to review at your convenience later.  YOU SHOULD EXPECT: Some feelings of bloating in the abdomen. Passage of more gas than usual.  Walking can help get rid of the air that was put into your GI tract during the procedure and reduce the bloating. If you had a lower endoscopy (such as a colonoscopy or flexible sigmoidoscopy) you may notice spotting of blood in your stool or on the toilet paper. If you underwent a bowel prep for your procedure, you may not have a normal bowel movement for a few days.  Please Note:  You might notice some irritation and congestion in your nose or some drainage.  This is from the oxygen used during your procedure.  There is no need for concern and it should clear up in a day or so.  SYMPTOMS TO REPORT IMMEDIATELY:   Following lower endoscopy (colonoscopy or flexible sigmoidoscopy):  Excessive amounts of blood in the stool  Significant tenderness or worsening of abdominal pains  Swelling of the abdomen that is new, acute  Fever of 100F or higher    For urgent or emergent issues, a gastroenterologist can be reached at any hour by calling [phone].   DIET:  We do recommend a small meal at first, but then you may proceed to your regular diet.  Drink plenty of fluids but you should avoid alcoholic beverages for 24 hours.  ACTIVITY:  You should plan to take it easy for the rest of  today and you should NOT DRIVE or use heavy machinery until tomorrow (because of the sedation medicines used during the test).    FOLLOW UP: Our staff will call the number listed on your records the next business day following your procedure to check on you and address any questions or concerns that you may have regarding the information given to you following your procedure. If we do not reach you, we will leave a message.  However, if you are feeling well and you are not experiencing any problems, there is no need to return our call.  We will assume that you have returned to your regular daily activities without incident.  If any biopsies were taken you will be contacted by phone or by letter within the next 1-3 weeks.  Please call us at [phone] if you have not heard about the biopsies in 3 weeks.    SIGNATURES/CONFIDENTIALITY: You and/or your care partner have signed paperwork which will be entered into your electronic medical record.  These signatures attest to the fact that that the information above on your After Visit Summary has been reviewed and is understood.  Full responsibility of the confidentiality of this discharge information lies with you and/or your care-partner.

## 2016-04-18 ENCOUNTER — Telehealth: Payer: Self-pay | Admitting: *Deleted

## 2016-04-18 NOTE — Telephone Encounter (Signed)
  Follow up Call-  Call back number 04/17/2016  Post procedure Call Back phone  # [phone]  Permission to leave phone message Yes     Patient questions:  Do you have a fever, pain , or abdominal swelling? No. Pain Score  0 *  Have you tolerated food without any problems? Yes.    Have you been able to return to your normal activities? Yes.    Do you have any questions about your discharge instructions: Diet   No. Medications  No. Follow up visit  No.  Do you have questions or concerns about your Care? No.  Actions: * If pain score is 4 or above: No action needed, pain <4.

## 2016-04-19 ENCOUNTER — Telehealth: Payer: Self-pay | Admitting: Family Medicine

## 2016-04-19 NOTE — Telephone Encounter (Signed)
Pt states that she is having sinus like issues, pt is scheduled to fly out to visit her sister who has cancer and want to know if she is ok to be around her sister or not.

## 2016-04-19 NOTE — Telephone Encounter (Signed)
Patient notified of PCP recommendations and is agreement and expresses an understanding.  

## 2016-04-19 NOTE — Telephone Encounter (Signed)
As long as pt doesn't have a fever and can cover her cough and wash her hands, she should be fine.

## 2016-04-24 ENCOUNTER — Other Ambulatory Visit: Payer: Self-pay | Admitting: Family Medicine

## 2016-04-25 NOTE — Telephone Encounter (Signed)
Last OV 01/20/16 paxil last filled 03/28/16 #30 with 6

## 2016-04-28 ENCOUNTER — Encounter: Payer: Self-pay | Admitting: Internal Medicine

## 2016-05-18 ENCOUNTER — Telehealth: Payer: Self-pay | Admitting: Family Medicine

## 2016-05-18 MED ORDER — PAROXETINE HCL 40 MG PO TABS: 40.0000 mg | ORAL_TABLET | ORAL | 6 refills | Status: DC

## 2016-05-18 MED ORDER — TIZANIDINE HCL 4 MG PO TABS: 4.0000 mg | ORAL_TABLET | Freq: Three times a day (TID) | ORAL | 1 refills | Status: DC | PRN

## 2016-05-18 NOTE — Telephone Encounter (Signed)
Patient calling to request refills of the following medications:  PARoxetine (PAXIL) 40 MG tablet tiZANidine (ZANAFLEX) 4 MG tablet  Pharmacy:  [facility] [address] [address], tel # [phone].

## 2016-05-18 NOTE — Telephone Encounter (Signed)
Medication filled to pharmacy as requested.   

## 2016-06-05 DIAGNOSIS — Z01818 Encounter for other preprocedural examination: Secondary | ICD-10-CM | POA: Diagnosis not present

## 2016-06-14 ENCOUNTER — Other Ambulatory Visit: Payer: Self-pay | Admitting: Family Medicine

## 2016-06-14 MED ORDER — CLONAZEPAM 1 MG PO TABS: 1.0000 mg | ORAL_TABLET | Freq: Three times a day (TID) | ORAL | 0 refills | Status: DC

## 2016-06-14 MED ORDER — TIZANIDINE HCL 4 MG PO TABS: 4.0000 mg | ORAL_TABLET | Freq: Three times a day (TID) | ORAL | 0 refills | Status: AC | PRN

## 2016-06-14 MED ORDER — PAROXETINE HCL 40 MG PO TABS: 40.0000 mg | ORAL_TABLET | ORAL | 1 refills | Status: DC

## 2016-06-14 NOTE — Telephone Encounter (Signed)
Pt needs refill on paroxetine, tizanidine, clonazepam, pt states that she is in the process of moving and will be looking for a new pcp, [facility] [address], [place] [address]. Ph [phone].

## 2016-06-14 NOTE — Telephone Encounter (Signed)
Last OV 01/20/16 Tizanidine last filled 05/18/16 #90 with 1 Clonazepam last filled 04/17/16 #90 with 1 Paroxetine last filled 05/18/16 #30 with 6   Please advise, these last Rx's were sent to [facility] (where she was staying with her sister) pt is now requesting them to be sent to [facility].

## 2016-06-21 ENCOUNTER — Telehealth: Payer: Self-pay | Admitting: Neurology

## 2016-06-21 NOTE — Telephone Encounter (Signed)
FYI

## 2016-06-21 NOTE — Telephone Encounter (Signed)
Called pt to confirm she wanted to cancel her Botox appt. On 06/23/16.   She is in [place] and will be having surgery on Feb. 21st.  Not sure when she will be back to [place] or if she will come back.  Wanted this info passed on to Dr. Carles Collet

## 2016-06-23 ENCOUNTER — Ambulatory Visit: Payer: Medicare Other | Admitting: Neurology

## 2016-07-04 DIAGNOSIS — M25512 Pain in left shoulder: Secondary | ICD-10-CM | POA: Diagnosis not present

## 2016-07-04 DIAGNOSIS — F3341 Major depressive disorder, recurrent, in partial remission: Secondary | ICD-10-CM | POA: Diagnosis not present

## 2016-07-04 DIAGNOSIS — M67912 Unspecified disorder of synovium and tendon, left shoulder: Secondary | ICD-10-CM | POA: Diagnosis not present

## 2016-07-04 DIAGNOSIS — Z7689 Persons encountering health services in other specified circumstances: Secondary | ICD-10-CM | POA: Diagnosis not present

## 2016-07-04 DIAGNOSIS — F411 Generalized anxiety disorder: Secondary | ICD-10-CM | POA: Diagnosis not present

## 2016-07-04 DIAGNOSIS — I950 Idiopathic hypotension: Secondary | ICD-10-CM | POA: Diagnosis not present

## 2016-07-04 DIAGNOSIS — Z79899 Other long term (current) drug therapy: Secondary | ICD-10-CM | POA: Diagnosis not present

## 2016-07-04 DIAGNOSIS — E782 Mixed hyperlipidemia: Secondary | ICD-10-CM | POA: Diagnosis not present

## 2016-07-11 DIAGNOSIS — Z6827 Body mass index (BMI) 27.0-27.9, adult: Secondary | ICD-10-CM | POA: Diagnosis not present

## 2016-07-11 DIAGNOSIS — D649 Anemia, unspecified: Secondary | ICD-10-CM | POA: Diagnosis not present

## 2016-07-11 DIAGNOSIS — D496 Neoplasm of unspecified behavior of brain: Secondary | ICD-10-CM | POA: Diagnosis not present

## 2016-07-11 DIAGNOSIS — I10 Essential (primary) hypertension: Secondary | ICD-10-CM | POA: Diagnosis present

## 2016-07-11 DIAGNOSIS — R739 Hyperglycemia, unspecified: Secondary | ICD-10-CM | POA: Diagnosis not present

## 2016-07-11 DIAGNOSIS — D321 Benign neoplasm of spinal meninges: Secondary | ICD-10-CM | POA: Diagnosis not present

## 2016-07-11 DIAGNOSIS — S43422S Sprain of left rotator cuff capsule, sequela: Secondary | ICD-10-CM | POA: Diagnosis not present

## 2016-07-11 DIAGNOSIS — R112 Nausea with vomiting, unspecified: Secondary | ICD-10-CM | POA: Diagnosis not present

## 2016-07-11 DIAGNOSIS — F329 Major depressive disorder, single episode, unspecified: Secondary | ICD-10-CM | POA: Diagnosis not present

## 2016-07-11 DIAGNOSIS — F3289 Other specified depressive episodes: Secondary | ICD-10-CM | POA: Diagnosis not present

## 2016-07-11 DIAGNOSIS — G9529 Other cord compression: Secondary | ICD-10-CM | POA: Diagnosis present

## 2016-07-11 DIAGNOSIS — E785 Hyperlipidemia, unspecified: Secondary | ICD-10-CM | POA: Diagnosis present

## 2016-07-11 DIAGNOSIS — S199XXS Unspecified injury of neck, sequela: Secondary | ICD-10-CM | POA: Diagnosis not present

## 2016-07-11 DIAGNOSIS — Z9889 Other specified postprocedural states: Secondary | ICD-10-CM | POA: Diagnosis not present

## 2016-07-11 DIAGNOSIS — Z01818 Encounter for other preprocedural examination: Secondary | ICD-10-CM | POA: Diagnosis not present

## 2016-07-11 DIAGNOSIS — G9519 Other vascular myelopathies: Secondary | ICD-10-CM | POA: Diagnosis not present

## 2016-07-11 DIAGNOSIS — D497 Neoplasm of unspecified behavior of endocrine glands and other parts of nervous system: Secondary | ICD-10-CM | POA: Diagnosis not present

## 2016-07-11 DIAGNOSIS — E663 Overweight: Secondary | ICD-10-CM | POA: Diagnosis present

## 2016-07-11 DIAGNOSIS — R52 Pain, unspecified: Secondary | ICD-10-CM | POA: Diagnosis not present

## 2016-07-11 DIAGNOSIS — J984 Other disorders of lung: Secondary | ICD-10-CM | POA: Diagnosis not present

## 2016-07-11 DIAGNOSIS — M436 Torticollis: Secondary | ICD-10-CM | POA: Diagnosis not present

## 2016-07-11 DIAGNOSIS — G629 Polyneuropathy, unspecified: Secondary | ICD-10-CM | POA: Diagnosis not present

## 2016-07-11 DIAGNOSIS — G8918 Other acute postprocedural pain: Secondary | ICD-10-CM | POA: Diagnosis not present

## 2016-07-11 DIAGNOSIS — G9589 Other specified diseases of spinal cord: Secondary | ICD-10-CM | POA: Diagnosis not present

## 2016-07-25 DIAGNOSIS — F419 Anxiety disorder, unspecified: Secondary | ICD-10-CM | POA: Diagnosis not present

## 2016-07-25 DIAGNOSIS — G243 Spasmodic torticollis: Secondary | ICD-10-CM | POA: Diagnosis not present

## 2016-07-25 DIAGNOSIS — D321 Benign neoplasm of spinal meninges: Secondary | ICD-10-CM | POA: Diagnosis not present

## 2016-07-25 DIAGNOSIS — R269 Unspecified abnormalities of gait and mobility: Secondary | ICD-10-CM | POA: Diagnosis not present

## 2016-08-01 ENCOUNTER — Other Ambulatory Visit: Payer: Self-pay | Admitting: Family Medicine

## 2016-08-01 MED ORDER — CLONAZEPAM 1 MG PO TABS: 1.0000 mg | ORAL_TABLET | Freq: Three times a day (TID) | ORAL | 0 refills | Status: AC | PRN

## 2016-08-01 MED ORDER — PAROXETINE HCL 40 MG PO TABS: 40.0000 mg | ORAL_TABLET | ORAL | 0 refills | Status: AC

## 2016-08-01 NOTE — Telephone Encounter (Signed)
Pt states that she has moved from [place] to [facility] and has just had surgery, Pt states that she has a NP appt on 3/19, but asking if [facility] would call in refills on paxil and klonopin, pt states that she will run out before then, [facility] [place] phone # [phone].

## 2016-08-01 NOTE — Telephone Encounter (Signed)
Please advise,   Last OV 01/20/16 Clonazepam last filled 06/14/16 #90 with 0 paxil last filled 06/14/16 #30 with 1   Pt would like this faxed to [place].

## 2016-08-01 NOTE — Telephone Encounter (Signed)
Medication filled to pharmacy as requested.  Dr. Birdie Riddle removed as PCP in chart.

## 2016-08-01 NOTE — Telephone Encounter (Signed)
Ok for #30 of each to last her the 6 days until her new pt appt

## 2016-08-07 DIAGNOSIS — M67912 Unspecified disorder of synovium and tendon, left shoulder: Secondary | ICD-10-CM | POA: Diagnosis not present

## 2016-08-07 DIAGNOSIS — G8929 Other chronic pain: Secondary | ICD-10-CM | POA: Diagnosis not present

## 2016-08-07 DIAGNOSIS — M25512 Pain in left shoulder: Secondary | ICD-10-CM | POA: Diagnosis not present

## 2016-08-14 DIAGNOSIS — M75112 Incomplete rotator cuff tear or rupture of left shoulder, not specified as traumatic: Secondary | ICD-10-CM | POA: Diagnosis not present

## 2016-08-15 DIAGNOSIS — M542 Cervicalgia: Secondary | ICD-10-CM | POA: Diagnosis not present

## 2016-08-15 DIAGNOSIS — G243 Spasmodic torticollis: Secondary | ICD-10-CM | POA: Diagnosis not present

## 2016-08-29 DIAGNOSIS — G243 Spasmodic torticollis: Secondary | ICD-10-CM | POA: Diagnosis not present

## 2016-08-29 DIAGNOSIS — M75112 Incomplete rotator cuff tear or rupture of left shoulder, not specified as traumatic: Secondary | ICD-10-CM | POA: Diagnosis not present

## 2016-09-04 DIAGNOSIS — G243 Spasmodic torticollis: Secondary | ICD-10-CM | POA: Diagnosis not present

## 2016-09-04 DIAGNOSIS — M75112 Incomplete rotator cuff tear or rupture of left shoulder, not specified as traumatic: Secondary | ICD-10-CM | POA: Diagnosis not present

## 2016-09-05 DIAGNOSIS — G243 Spasmodic torticollis: Secondary | ICD-10-CM | POA: Diagnosis not present

## 2016-09-05 DIAGNOSIS — M75112 Incomplete rotator cuff tear or rupture of left shoulder, not specified as traumatic: Secondary | ICD-10-CM | POA: Diagnosis not present

## 2016-09-11 DIAGNOSIS — G243 Spasmodic torticollis: Secondary | ICD-10-CM | POA: Diagnosis not present

## 2016-09-11 DIAGNOSIS — M75112 Incomplete rotator cuff tear or rupture of left shoulder, not specified as traumatic: Secondary | ICD-10-CM | POA: Diagnosis not present

## 2016-09-12 DIAGNOSIS — G243 Spasmodic torticollis: Secondary | ICD-10-CM | POA: Diagnosis not present

## 2016-09-12 DIAGNOSIS — M75112 Incomplete rotator cuff tear or rupture of left shoulder, not specified as traumatic: Secondary | ICD-10-CM | POA: Diagnosis not present

## 2016-09-18 DIAGNOSIS — M75112 Incomplete rotator cuff tear or rupture of left shoulder, not specified as traumatic: Secondary | ICD-10-CM | POA: Diagnosis not present

## 2016-09-18 DIAGNOSIS — G243 Spasmodic torticollis: Secondary | ICD-10-CM | POA: Diagnosis not present

## 2016-09-26 DIAGNOSIS — M75112 Incomplete rotator cuff tear or rupture of left shoulder, not specified as traumatic: Secondary | ICD-10-CM | POA: Diagnosis not present

## 2016-09-26 DIAGNOSIS — G243 Spasmodic torticollis: Secondary | ICD-10-CM | POA: Diagnosis not present

## 2016-10-02 DIAGNOSIS — M75112 Incomplete rotator cuff tear or rupture of left shoulder, not specified as traumatic: Secondary | ICD-10-CM | POA: Diagnosis not present

## 2016-10-02 DIAGNOSIS — G243 Spasmodic torticollis: Secondary | ICD-10-CM | POA: Diagnosis not present

## 2016-10-03 DIAGNOSIS — M75112 Incomplete rotator cuff tear or rupture of left shoulder, not specified as traumatic: Secondary | ICD-10-CM | POA: Diagnosis not present

## 2016-10-03 DIAGNOSIS — G243 Spasmodic torticollis: Secondary | ICD-10-CM | POA: Diagnosis not present

## 2016-10-24 DIAGNOSIS — Z23 Encounter for immunization: Secondary | ICD-10-CM | POA: Diagnosis not present

## 2016-10-24 DIAGNOSIS — Z0000 Encounter for general adult medical examination without abnormal findings: Secondary | ICD-10-CM | POA: Diagnosis not present

## 2016-10-24 DIAGNOSIS — Z7182 Exercise counseling: Secondary | ICD-10-CM | POA: Diagnosis not present

## 2016-10-24 DIAGNOSIS — Z1159 Encounter for screening for other viral diseases: Secondary | ICD-10-CM | POA: Diagnosis not present

## 2016-10-24 DIAGNOSIS — Z713 Dietary counseling and surveillance: Secondary | ICD-10-CM | POA: Diagnosis not present

## 2016-10-24 DIAGNOSIS — M60819 Other myositis, unspecified shoulder: Secondary | ICD-10-CM | POA: Diagnosis not present

## 2016-10-24 DIAGNOSIS — Z124 Encounter for screening for malignant neoplasm of cervix: Secondary | ICD-10-CM | POA: Diagnosis not present

## 2016-10-24 DIAGNOSIS — Z1231 Encounter for screening mammogram for malignant neoplasm of breast: Secondary | ICD-10-CM | POA: Diagnosis not present

## 2016-10-31 DIAGNOSIS — F410 Panic disorder [episodic paroxysmal anxiety] without agoraphobia: Secondary | ICD-10-CM | POA: Diagnosis not present

## 2016-10-31 DIAGNOSIS — G243 Spasmodic torticollis: Secondary | ICD-10-CM | POA: Diagnosis not present

## 2016-10-31 DIAGNOSIS — F3341 Major depressive disorder, recurrent, in partial remission: Secondary | ICD-10-CM | POA: Diagnosis not present

## 2016-10-31 DIAGNOSIS — G2401 Drug induced subacute dyskinesia: Secondary | ICD-10-CM | POA: Diagnosis not present

## 2016-10-31 DIAGNOSIS — Z1389 Encounter for screening for other disorder: Secondary | ICD-10-CM | POA: Diagnosis not present

## 2016-10-31 DIAGNOSIS — F411 Generalized anxiety disorder: Secondary | ICD-10-CM | POA: Diagnosis not present

## 2016-11-14 DIAGNOSIS — M542 Cervicalgia: Secondary | ICD-10-CM | POA: Diagnosis not present

## 2016-11-14 DIAGNOSIS — G243 Spasmodic torticollis: Secondary | ICD-10-CM | POA: Diagnosis not present

## 2017-01-31 DIAGNOSIS — F411 Generalized anxiety disorder: Secondary | ICD-10-CM | POA: Diagnosis not present

## 2017-01-31 DIAGNOSIS — G2401 Drug induced subacute dyskinesia: Secondary | ICD-10-CM | POA: Diagnosis not present

## 2017-01-31 DIAGNOSIS — F3341 Major depressive disorder, recurrent, in partial remission: Secondary | ICD-10-CM | POA: Diagnosis not present

## 2017-01-31 DIAGNOSIS — F410 Panic disorder [episodic paroxysmal anxiety] without agoraphobia: Secondary | ICD-10-CM | POA: Diagnosis not present

## 2017-02-02 DIAGNOSIS — N63 Unspecified lump in unspecified breast: Secondary | ICD-10-CM | POA: Diagnosis not present

## 2017-02-02 DIAGNOSIS — I950 Idiopathic hypotension: Secondary | ICD-10-CM | POA: Diagnosis not present

## 2017-02-02 DIAGNOSIS — Z01419 Encounter for gynecological examination (general) (routine) without abnormal findings: Secondary | ICD-10-CM | POA: Diagnosis not present

## 2017-02-02 DIAGNOSIS — N852 Hypertrophy of uterus: Secondary | ICD-10-CM | POA: Diagnosis not present

## 2017-02-02 DIAGNOSIS — Z124 Encounter for screening for malignant neoplasm of cervix: Secondary | ICD-10-CM | POA: Diagnosis not present

## 2017-02-02 DIAGNOSIS — E559 Vitamin D deficiency, unspecified: Secondary | ICD-10-CM | POA: Diagnosis not present

## 2017-02-05 DIAGNOSIS — Z1231 Encounter for screening mammogram for malignant neoplasm of breast: Secondary | ICD-10-CM | POA: Diagnosis not present

## 2017-02-07 DIAGNOSIS — G44229 Chronic tension-type headache, not intractable: Secondary | ICD-10-CM | POA: Diagnosis not present

## 2017-02-13 DIAGNOSIS — G243 Spasmodic torticollis: Secondary | ICD-10-CM | POA: Diagnosis not present

## 2017-02-13 DIAGNOSIS — M542 Cervicalgia: Secondary | ICD-10-CM | POA: Diagnosis not present

## 2017-03-12 DIAGNOSIS — F3341 Major depressive disorder, recurrent, in partial remission: Secondary | ICD-10-CM | POA: Diagnosis not present

## 2017-03-12 DIAGNOSIS — G2401 Drug induced subacute dyskinesia: Secondary | ICD-10-CM | POA: Diagnosis not present

## 2017-03-12 DIAGNOSIS — F411 Generalized anxiety disorder: Secondary | ICD-10-CM | POA: Diagnosis not present

## 2017-03-12 DIAGNOSIS — F410 Panic disorder [episodic paroxysmal anxiety] without agoraphobia: Secondary | ICD-10-CM | POA: Diagnosis not present

## 2019-03-10 ENCOUNTER — Other Ambulatory Visit: Payer: Self-pay

## 2019-03-10 ENCOUNTER — Other Ambulatory Visit (HOSPITAL_COMMUNITY): Admission: RE | Admit: 2019-03-10 | Payer: Medicare Other | Source: Ambulatory Visit | Admitting: Internal Medicine

## 2021-02-26 ENCOUNTER — Ambulatory Visit: Payer: Medicare Other

## 2021-02-26 ENCOUNTER — Other Ambulatory Visit: Payer: Self-pay

## 2021-02-26 ENCOUNTER — Other Ambulatory Visit: Payer: Self-pay | Admitting: Family Medicine

## 2021-02-26 ENCOUNTER — Ambulatory Visit
Admission: RE | Admit: 2021-02-26 | Discharge: 2021-02-26 | Disposition: A | Discharge status: Home / Self Care | Payer: BLUE CROSS/BLUE SHIELD | Source: Ambulatory Visit | Attending: Family Medicine | Admitting: Family Medicine

## 2021-02-26 DIAGNOSIS — Z0000 Encounter for general adult medical examination without abnormal findings: Secondary | ICD-10-CM

## 2021-05-08 ENCOUNTER — Encounter: Payer: Self-pay | Admitting: Internal Medicine

## 2022-10-18 IMAGING — MG MM DIGITAL SCREENING BILAT W/ TOMO AND CAD
6 of 10 series · 6 of 30 positions shown · non-contrast
Comparison: None.

CLINICAL DATA: Screening.

EXAM:
DIGITAL SCREENING BILATERAL MAMMOGRAM WITH TOMOSYNTHESIS AND CAD
TECHNIQUE: Bilateral screening digital craniocaudal and mediolateral oblique
mammograms were obtained. Bilateral screening digital breast
tomosynthesis was performed. The images were evaluated with
computer-aided detection.

[L CC synth-2D]
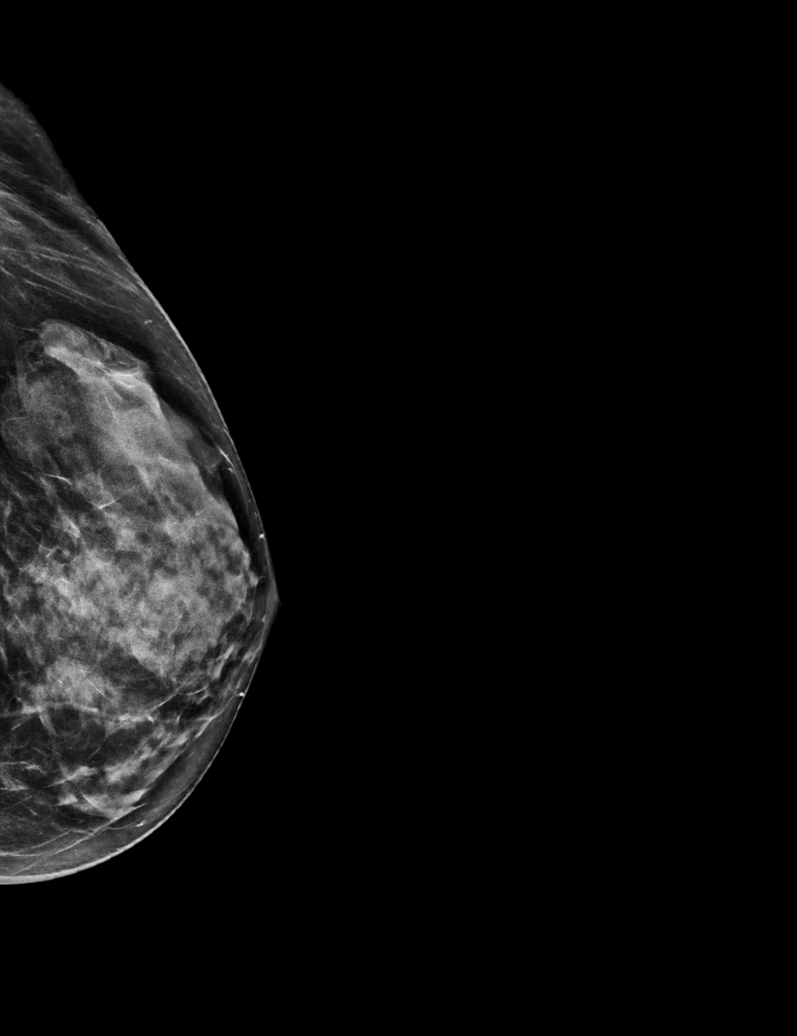

[L MLO synth-2D]
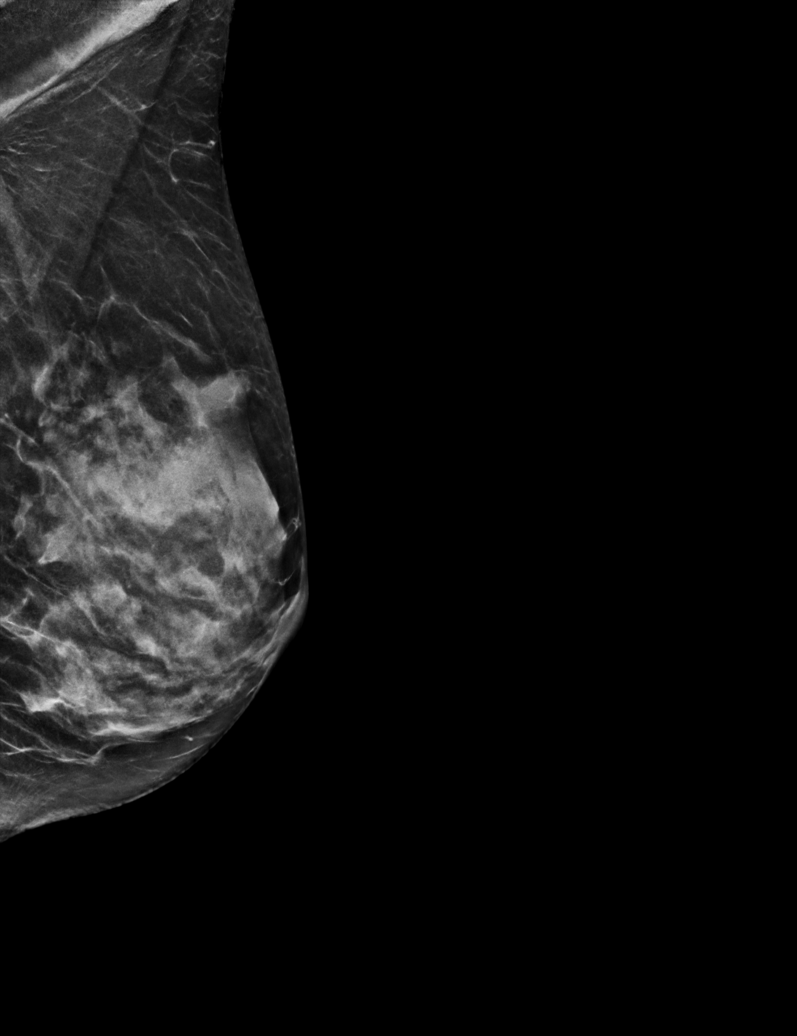

[R XCCL synth-2D]
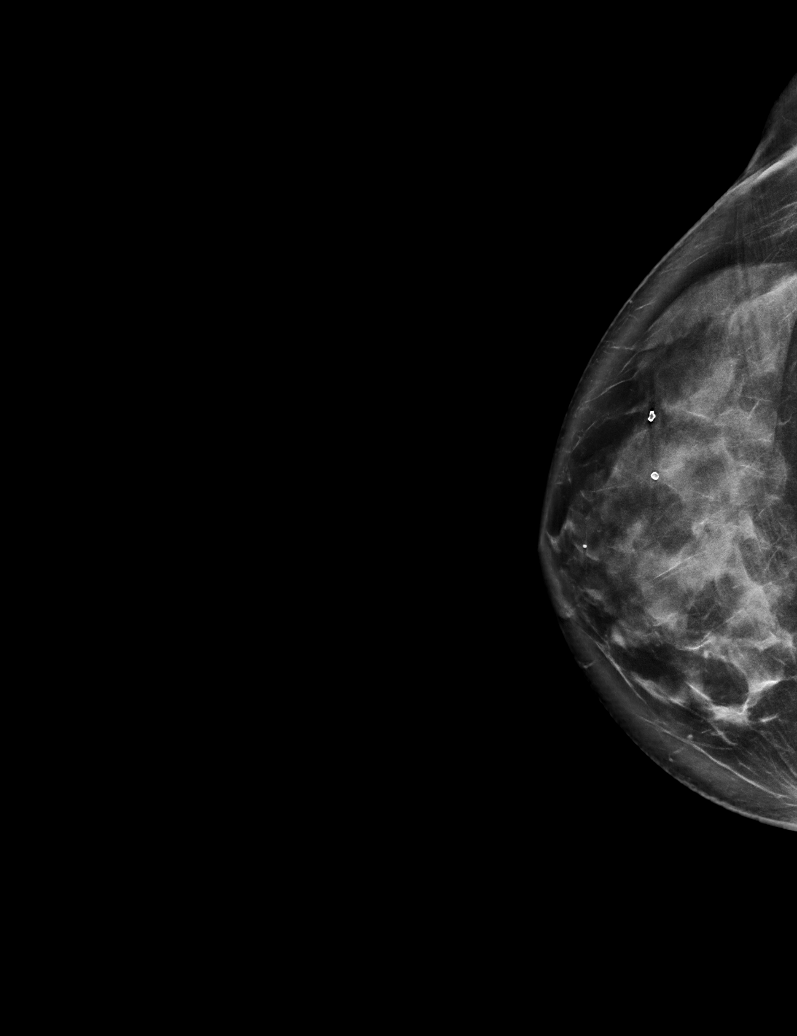

[R CC synth-2D]
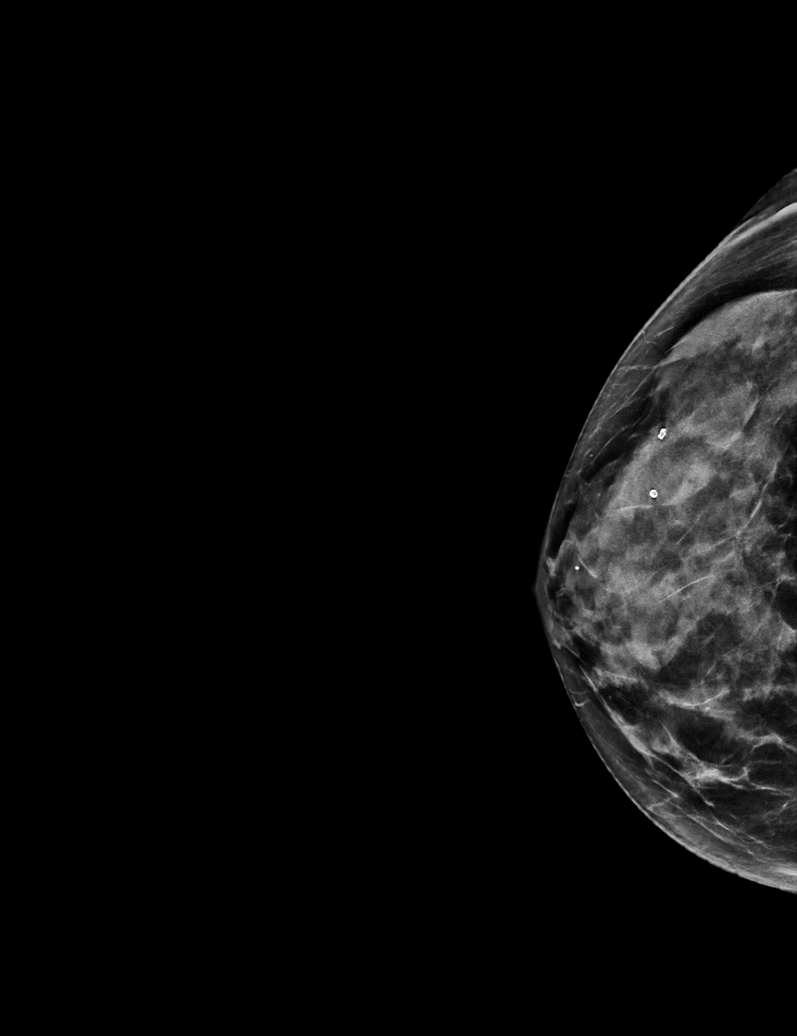

[R MLO synth-2D]
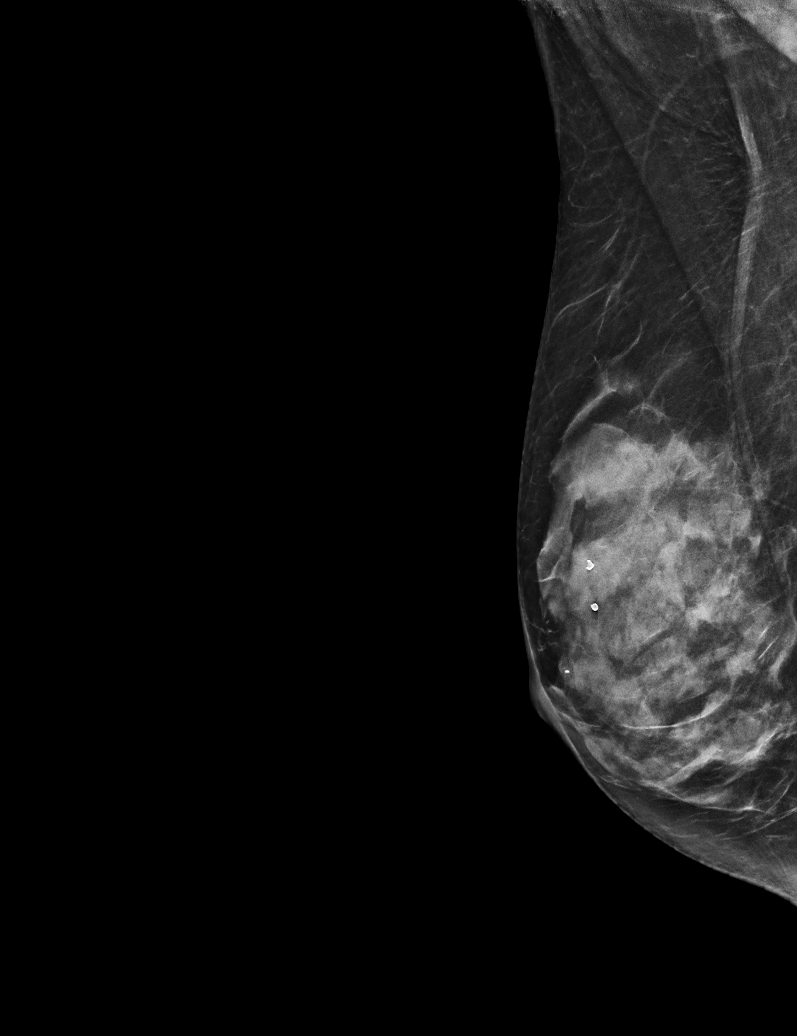

[R MLO tomo · tomo slice 28/55.0]
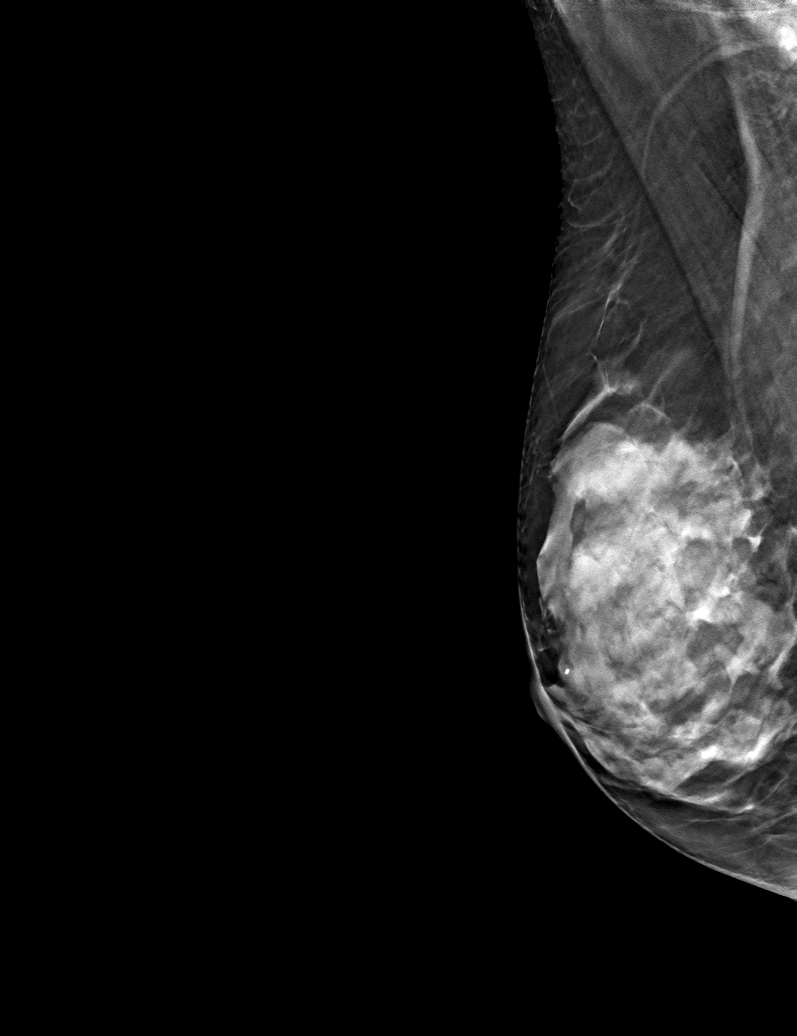

[6 of 30 positions shown; findings below may reference images not displayed]

ACR Breast Density Category c: The breast tissue is heterogeneously
dense, which may obscure small masses
FINDINGS: There are no findings suspicious for malignancy.
IMPRESSION: No mammographic evidence of malignancy. A result letter of this
screening mammogram will be mailed directly to the patient.

RECOMMENDATION:
Screening mammogram in one year. (Code:C8-T-HNK)

BI-RADS CATEGORY  1: Negative.
